# Patient Record
Sex: Male | Born: 1995 | Hispanic: Refuse to answer | Marital: Single
Health system: Southern US, Community
[De-identification: ages and names within clinical notes are randomized; demographics above are authoritative.]

---

## 2018-08-20 ENCOUNTER — Ambulatory Visit (INDEPENDENT_AMBULATORY_CARE_PROVIDER_SITE_OTHER): Payer: BLUE CROSS/BLUE SHIELD | Admitting: Family Medicine

## 2018-08-20 VITALS — BP 140/83 | HR 103 | Temp 99.1°F | Resp 14

## 2018-08-20 DIAGNOSIS — J029 Acute pharyngitis, unspecified: Secondary | ICD-10-CM

## 2018-08-20 LAB — POCT INFLUENZA A/B
INFLUENZA A, POC: NEGATIVE
INFLUENZA B, POC: NEGATIVE

## 2018-08-20 LAB — POCT RAPID STREP A (OFFICE): Rapid Strep A Screen: NEGATIVE

## 2018-08-20 NOTE — Progress Notes (Signed)
Patient presents today with symptoms of sore throat, cough, fever, fatigue, and myalgias.  Patient states that he has had the symptoms for the last few days.  Denies any abdominal pain or diarrhea.  He does state that he has vomited due to increased coughing.  He denies any chest pain, shortness of breath, neck stiffness, severe headache.  He has been taking Tylenol and Ibuprofen intermittently.  He is unsure whether he had the flu vaccination this year.  He has sick contacts on the football team.  ROS: Negative except mentioned above. Vitals as per Epic. GENERAL: NAD HEENT: mild pharyngeal erythema, no exudate, no erythema of TMs, shotty cervical LAD RESP: CTA B CARD: RRR ABD: Positive bowel sounds, nontender, no organomegaly appreciated NEURO: CN II-XII grossly intact   A/P: Pharyngitis, Fever - rapid strep test and influenza screen in the office were negative, will do CBC and Monospot test, rest, hydration, Tylenol 1000 mgs given to patient in the office today, Ibuprofen as needed with food, Delsym as needed for cough, no athletic activity or class until afebrile for 24 hours without taking fever lowering medication, seek medical attention if symptoms persist or worsen as discussed. Will discuss above with athletic trainer.  Advised patient to inform academic advisor and professors.

## 2018-08-21 LAB — CBC WITH DIFFERENTIAL/PLATELET
Basophils Absolute: 0 10*3/uL (ref 0.0–0.2)
Basos: 0 %
EOS (ABSOLUTE): 0.1 10*3/uL (ref 0.0–0.4)
Eos: 2 %
Hematocrit: 37.6 % (ref 37.5–51.0)
Hemoglobin: 13.1 g/dL (ref 13.0–17.7)
IMMATURE GRANULOCYTES: 0 %
Immature Grans (Abs): 0 10*3/uL (ref 0.0–0.1)
Lymphocytes Absolute: 0.6 10*3/uL — ABNORMAL LOW (ref 0.7–3.1)
Lymphs: 10 %
MCH: 30.9 pg (ref 26.6–33.0)
MCHC: 34.8 g/dL (ref 31.5–35.7)
MCV: 89 fL (ref 79–97)
Monocytes Absolute: 0.8 10*3/uL (ref 0.1–0.9)
Monocytes: 13 %
Neutrophils Absolute: 4.3 10*3/uL (ref 1.4–7.0)
Neutrophils: 75 %
Platelets: 252 10*3/uL (ref 150–450)
RBC: 4.24 x10E6/uL (ref 4.14–5.80)
RDW: 12 % (ref 11.6–15.4)
WBC: 5.8 10*3/uL (ref 3.4–10.8)

## 2018-08-21 LAB — MONONUCLEOSIS SCREEN: Mono Screen: NEGATIVE

## 2018-08-22 ENCOUNTER — Ambulatory Visit (INDEPENDENT_AMBULATORY_CARE_PROVIDER_SITE_OTHER): Payer: BLUE CROSS/BLUE SHIELD | Admitting: Family Medicine

## 2018-08-22 VITALS — BP 122/84 | HR 90 | Temp 98.1°F | Resp 14

## 2018-08-22 DIAGNOSIS — B349 Viral infection, unspecified: Secondary | ICD-10-CM

## 2018-08-22 NOTE — Progress Notes (Signed)
Patient presents today for follow-up regarding his upper respiratory symptoms he presented with 2 days ago.  Patient states that yesterday he did have a fever.  Today he states that he has felt better he did take Tylenol today.  He did start his Tamiflu yesterday.  He denies any chest pain, shortness of breath, severe headache, nausea nausea, vomiting, diarrhea, abdominal pain.  He has been taking the over the counter cough suppressant as well.  He believes that he has been eating and drinking normally.  ROS: Negative except mentioned above. Vitals as per Epic. GENERAL: NAD HEENT: no pharyngeal erythema, no exudate, no erythema of TMs, no cervical LAD RESP: CTA B CARD: RRR NEURO: CN II-XII grossly intact   A/P: Viral Illness -likely influenza, started Tamiflu yesterday, continue Tylenol/Ibuprofen as needed, rest, hydration, no athletic activity or class until afebrile for 24 hours without taking fever lowering medication, seek medical attention if symptoms persist or worsen as discussed, will discuss above with athletic trainer.

## 2019-01-01 ENCOUNTER — Other Ambulatory Visit: Payer: Self-pay | Admitting: Hematology

## 2019-01-01 DIAGNOSIS — Z20822 Contact with and (suspected) exposure to covid-19: Secondary | ICD-10-CM

## 2019-01-01 NOTE — Progress Notes (Signed)
Please send results to PCP.  Patient is Landscape architect.

## 2019-01-02 ENCOUNTER — Other Ambulatory Visit: Payer: BLUE CROSS/BLUE SHIELD

## 2019-01-02 DIAGNOSIS — Z20822 Contact with and (suspected) exposure to covid-19: Secondary | ICD-10-CM

## 2019-01-03 LAB — NOVEL CORONAVIRUS, NAA: SARS-CoV-2, NAA: NOT DETECTED

## 2019-02-06 ENCOUNTER — Other Ambulatory Visit: Payer: Self-pay | Admitting: *Deleted

## 2019-02-06 DIAGNOSIS — Z20822 Contact with and (suspected) exposure to covid-19: Secondary | ICD-10-CM

## 2019-02-11 ENCOUNTER — Other Ambulatory Visit: Payer: Self-pay

## 2019-02-11 DIAGNOSIS — Z20822 Contact with and (suspected) exposure to covid-19: Secondary | ICD-10-CM

## 2019-02-16 LAB — NOVEL CORONAVIRUS, NAA: SARS-CoV-2, NAA: NOT DETECTED

## 2019-03-03 ENCOUNTER — Other Ambulatory Visit: Payer: Self-pay

## 2019-03-03 DIAGNOSIS — Z20822 Contact with and (suspected) exposure to covid-19: Secondary | ICD-10-CM

## 2019-03-06 LAB — NOVEL CORONAVIRUS, NAA: SARS-CoV-2, NAA: NOT DETECTED

## 2019-03-20 ENCOUNTER — Telehealth (INDEPENDENT_AMBULATORY_CARE_PROVIDER_SITE_OTHER): Payer: BC Managed Care – PPO | Admitting: Family Medicine

## 2019-03-20 ENCOUNTER — Other Ambulatory Visit: Payer: Self-pay | Admitting: Family Medicine

## 2019-03-20 DIAGNOSIS — J029 Acute pharyngitis, unspecified: Secondary | ICD-10-CM | POA: Diagnosis not present

## 2019-03-20 DIAGNOSIS — R6883 Chills (without fever): Secondary | ICD-10-CM

## 2019-03-20 MED ORDER — AMOXICILLIN 500 MG PO CAPS: 500.0000 mg | ORAL_CAPSULE | Freq: Two times a day (BID) | ORAL | 0 refills | Status: AC

## 2019-03-21 ENCOUNTER — Other Ambulatory Visit: Payer: Self-pay

## 2019-03-21 DIAGNOSIS — Z20822 Contact with and (suspected) exposure to covid-19: Secondary | ICD-10-CM

## 2019-03-21 NOTE — Progress Notes (Signed)
Verbal Consent for Virtual Visit given.  Patient with symptoms of sore throat, subjective fever, nasal congestion, headache for the last day. Denies any CP, SOB, cough, loss of taste, N/V/D, abdominal pain, severe headache. Denies any contact with positive COVID-19 in the last 2 weeks. Has taken Ibuprofen with some relief.   ROS: Negative except mentioned above.  A/P: Pharyngitis, Fever - will start patient on Amoxicillin, will test for COVID-19 as well, Tylenol/Ibuprofen prn, rest, hydration, isolate in room until test resulted negative, will see in office if COVID-19 negative. Seek immediate medical attention if symptoms persist or worsen as discussed.

## 2019-03-23 LAB — NOVEL CORONAVIRUS, NAA: SARS-CoV-2, NAA: NOT DETECTED

## 2019-04-02 ENCOUNTER — Other Ambulatory Visit: Payer: Self-pay | Admitting: Family Medicine

## 2019-04-02 DIAGNOSIS — Z1159 Encounter for screening for other viral diseases: Secondary | ICD-10-CM

## 2019-04-04 LAB — NOVEL CORONAVIRUS, NAA: SARS-CoV-2, NAA: NOT DETECTED

## 2019-04-11 ENCOUNTER — Other Ambulatory Visit: Payer: Self-pay

## 2019-05-06 ENCOUNTER — Other Ambulatory Visit: Payer: Self-pay | Admitting: Family Medicine

## 2019-05-06 DIAGNOSIS — Z8619 Personal history of other infectious and parasitic diseases: Secondary | ICD-10-CM

## 2019-05-06 DIAGNOSIS — Z8616 Personal history of COVID-19: Secondary | ICD-10-CM

## 2019-05-06 DIAGNOSIS — I400 Infective myocarditis: Secondary | ICD-10-CM

## 2019-05-06 DIAGNOSIS — U071 COVID-19: Secondary | ICD-10-CM

## 2019-05-08 ENCOUNTER — Other Ambulatory Visit: Payer: Self-pay

## 2019-05-08 ENCOUNTER — Other Ambulatory Visit: Payer: BC Managed Care – PPO | Admitting: Family Medicine

## 2019-05-08 DIAGNOSIS — Z0000 Encounter for general adult medical examination without abnormal findings: Secondary | ICD-10-CM

## 2019-05-09 LAB — TROPONIN I: Troponin I: <0.01 ng/mL (ref 0.00–0.04)

## 2019-05-13 ENCOUNTER — Other Ambulatory Visit: Payer: BC Managed Care – PPO

## 2019-05-18 ENCOUNTER — Telehealth: Payer: Self-pay | Admitting: Family Medicine

## 2019-05-18 NOTE — Telephone Encounter (Signed)
Called and explained to patient that Cardiology has reviewed the ECG and Echocardiogram and has not informed me that any further cardiac workup is needed for patient to be able to resume physical activity. Patient is to seek medical attention if any cardiopulmonary symptoms develop once activity is resumed. Patient addresses understanding and has no further questions or concerns.  

## 2019-05-19 ENCOUNTER — Ambulatory Visit (INDEPENDENT_AMBULATORY_CARE_PROVIDER_SITE_OTHER): Payer: BC Managed Care – PPO

## 2019-05-19 ENCOUNTER — Other Ambulatory Visit: Payer: Self-pay

## 2019-05-19 DIAGNOSIS — U071 COVID-19: Secondary | ICD-10-CM

## 2019-05-19 DIAGNOSIS — Z8619 Personal history of other infectious and parasitic diseases: Secondary | ICD-10-CM | POA: Diagnosis not present

## 2019-05-19 DIAGNOSIS — Z8616 Personal history of COVID-19: Secondary | ICD-10-CM

## 2019-05-19 DIAGNOSIS — I400 Infective myocarditis: Secondary | ICD-10-CM | POA: Diagnosis not present

## 2019-10-27 ENCOUNTER — Ambulatory Visit
Admission: RE | Admit: 2019-10-27 | Discharge: 2019-10-27 | Disposition: A | Discharge status: Home / Self Care | Payer: BC Managed Care – PPO | Source: Ambulatory Visit | Attending: Sports Medicine | Admitting: Sports Medicine

## 2019-10-27 ENCOUNTER — Other Ambulatory Visit: Payer: Self-pay | Admitting: Sports Medicine

## 2019-10-27 ENCOUNTER — Other Ambulatory Visit: Payer: Self-pay

## 2019-10-27 DIAGNOSIS — S6992XA Unspecified injury of left wrist, hand and finger(s), initial encounter: Secondary | ICD-10-CM | POA: Diagnosis not present

## 2020-04-19 ENCOUNTER — Other Ambulatory Visit: Payer: Self-pay | Admitting: Sports Medicine

## 2020-04-19 ENCOUNTER — Other Ambulatory Visit: Payer: Self-pay

## 2020-04-19 ENCOUNTER — Ambulatory Visit
Admission: RE | Admit: 2020-04-19 | Discharge: 2020-04-19 | Disposition: A | Discharge status: Home / Self Care | Payer: BC Managed Care – PPO | Source: Ambulatory Visit | Attending: Sports Medicine | Admitting: Sports Medicine

## 2020-04-19 ENCOUNTER — Ambulatory Visit
Admission: RE | Admit: 2020-04-19 | Discharge: 2020-04-19 | Disposition: A | Discharge status: Home / Self Care | Payer: BC Managed Care – PPO | Attending: Sports Medicine | Admitting: Sports Medicine

## 2020-04-19 DIAGNOSIS — R52 Pain, unspecified: Secondary | ICD-10-CM | POA: Insufficient documentation

## 2021-09-05 IMAGING — CR DG KNEE COMPLETE 4+V*L*
1 series · 4 of 4 positions shown · non-contrast
Comparison: None.

CLINICAL DATA: Pain due to injury

EXAM:
LEFT KNEE - COMPLETE 4+ VIEW

[Series 1: dg knee 4 v w/ sunrise/patella left · 0.14mm/px · 4 of 4 slices shown]
[im 1/4]
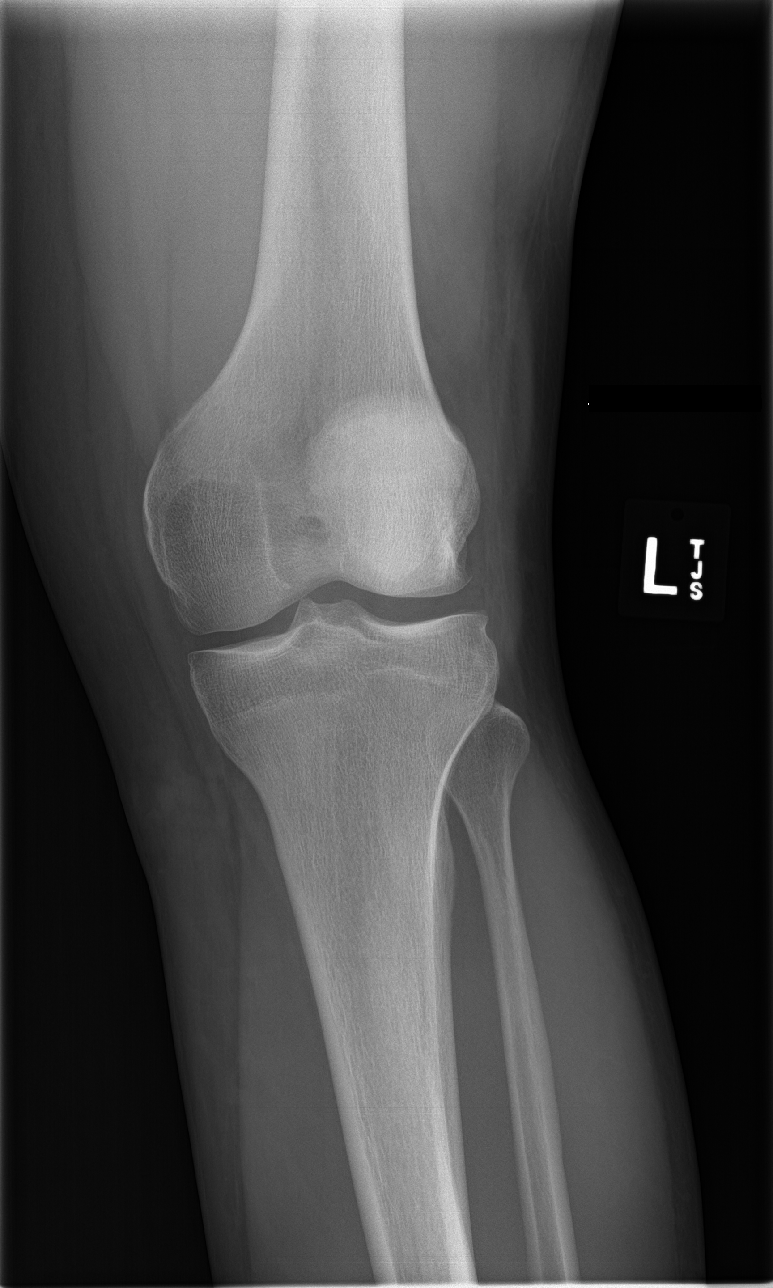
[im 2/4]
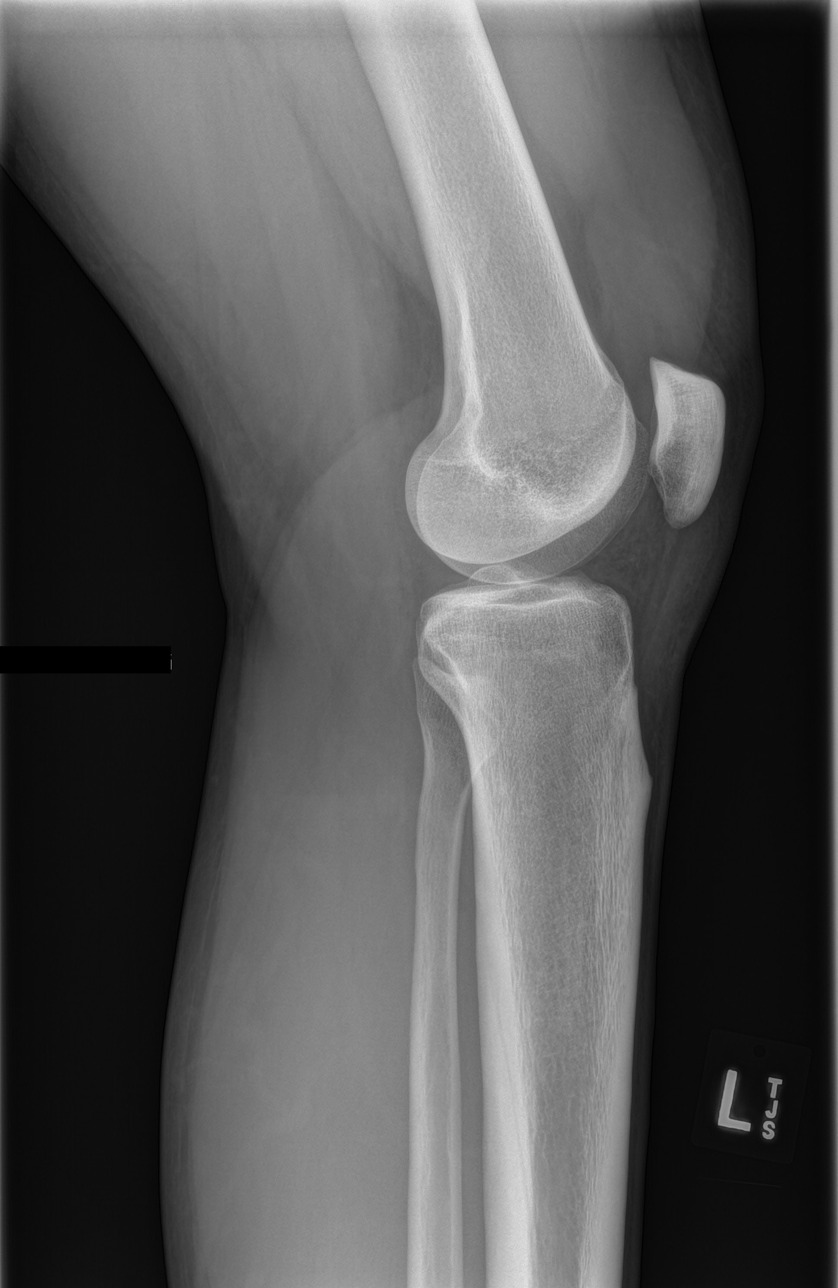
[im 3/4]
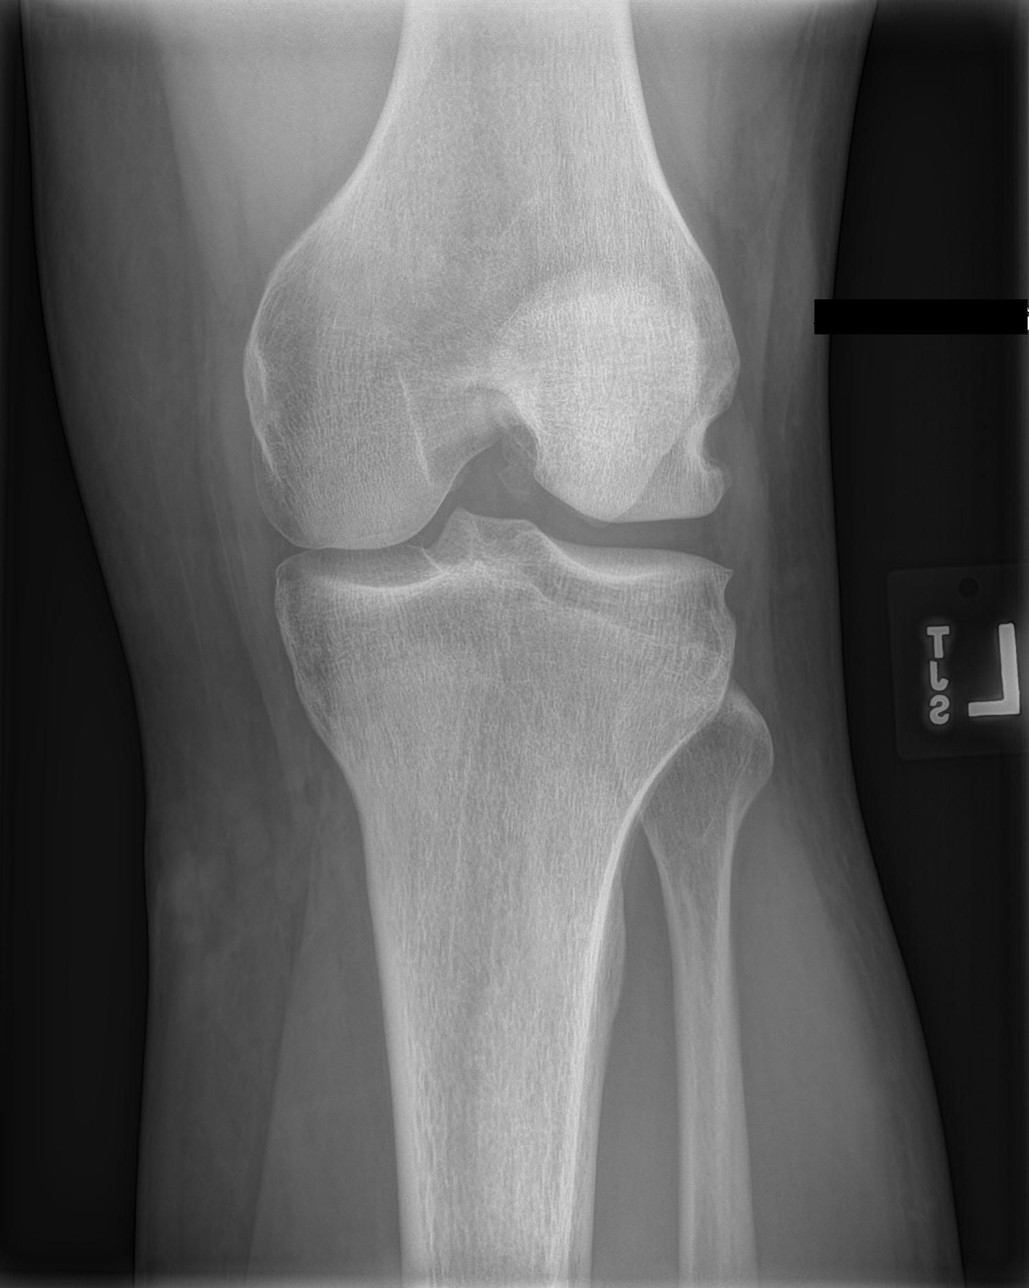
[im 4/4]
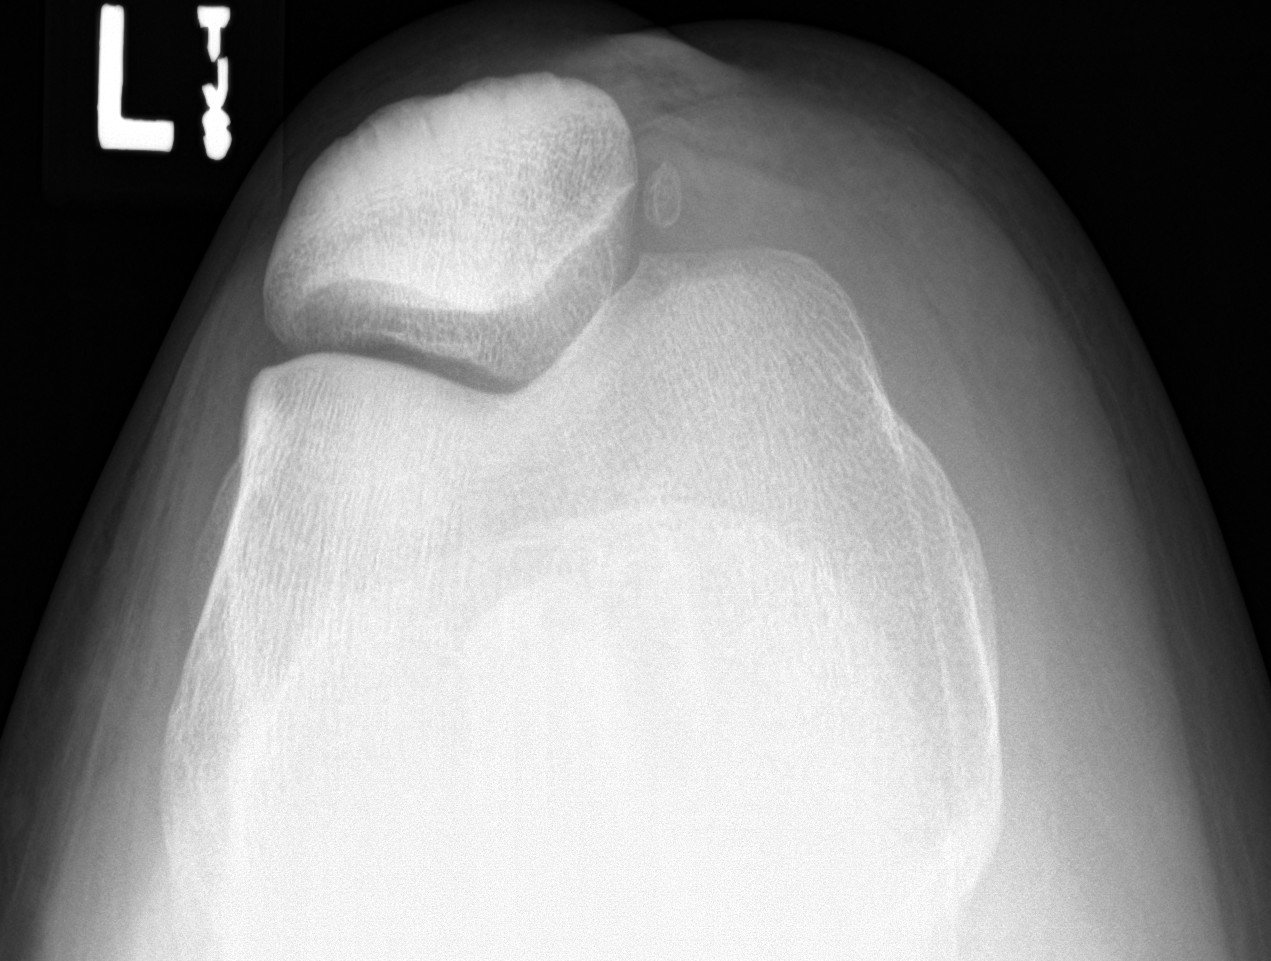

[4 of 4 positions shown; findings below may reference images not displayed]

FINDINGS: No acute displaced fracture or malalignment. Probable small moderate
knee effusion. Joint spaces appear maintained. Corticated opacity
adjacent to patella is doubtful for acute fracture.
IMPRESSION: No acute osseous abnormality. Probable small to moderate knee
effusion.
